# Patient Record
Sex: Female | Born: 1995 | Race: Asian | Hispanic: Refuse to answer | Marital: Married
Health system: Midwestern US, Community
[De-identification: ages and names within clinical notes are randomized; demographics above are authoritative.]

## PROBLEM LIST (undated history)

## (undated) DIAGNOSIS — L509 Urticaria, unspecified: Secondary | ICD-10-CM

## (undated) DIAGNOSIS — N946 Dysmenorrhea, unspecified: Secondary | ICD-10-CM

## (undated) HISTORY — DX: Urticaria, unspecified: L50.9

## (undated) MED ORDER — IMIQUIMOD 5 % TOPICAL CREAM PACKET: 5 % | PACK | CUTANEOUS | Status: AC

---
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
## 2013-02-25 LAB — AMB POC URINALYSIS DIP STICK AUTO W/O MICRO
Bilirubin (UA POC): NEGATIVE
Blood (UA POC): NEGATIVE
Glucose (UA POC): NEGATIVE
Ketones (UA POC): NEGATIVE
Leukocyte esterase (UA POC): NEGATIVE
Nitrites (UA POC): NEGATIVE
Specific gravity (UA POC): 1.025 (ref 1.001–1.035)
Urobilinogen (UA POC): 0.2 (ref 0.2–1)
pH (UA POC): 7 (ref 4.6–8.0)

## 2013-02-25 NOTE — Progress Notes (Signed)
HISTORY OF PRESENT ILLNESS  Alexandra Valenzuela is a 18 y.o. female.  HPI Comments: Pt c/o bumps to her vaginal area for 2 months. She states she showed them to her mother and her mother told her to get checked out. Pt states she is sexually active with one partner and is unprotected. She did have chlamydia last year and was treated. Pt denies any abnormal vaginal discharge. Pt thinks the bumps are from her shaving.     Sexually Transmitted Disease        Review of Systems   Constitutional: Negative for fever and chills.   Genitourinary: Negative for dysuria, urgency, frequency, hematuria and flank pain.   Skin:        See HPI     Neurological: Negative for tingling and sensory change.       Physical Exam   Constitutional: She is oriented to person, place, and time. She appears well-developed and well-nourished.   HENT:   Head: Normocephalic and atraumatic.   Genitourinary: Vaginal discharge (scant amount of white, thick vaginal discharge. 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   Neurological: She is alert and oriented to person, place, and time.   Skin: Skin is warm and dry.   Psychiatric: She has a normal mood and affect.     Visit Vitals   Item Reading   ??? BP 126/83   ??? Pulse 88   ??? Temp(Src) 98.9 ??F (37.2 ??C) (Oral)   ??? Resp 18   ??? Ht 5\' 3"  (1.6 m)   ??? Wt 162 lb (73.483 kg)   ??? BMI 28.7 kg/m2   ??? SpO2 100%     Results for orders placed in visit on 02/25/13   AMB POC URINALYSIS DIP STICK AUTO W/O MICRO       Result Value Range    Color (UA POC) Yellow      Clarity (UA POC) Clear      Glucose (UA POC) Negative  Negative    Bilirubin (UA POC) Negative  Negative    Ketones (UA POC) Negative  Negative    Specific gravity (UA POC) 1.025  1.001 - 1.035    Blood (UA POC) Negative  Negative    pH (UA POC) 7.0  4.6 - 8.0    Protein (UA POC) 2+  Negative mg/dL    Urobilinogen (UA POC) 0.2 mg/dL  0.2 - 1    Nitrites (UA POC) Negative  Negative    Leukocyte esterase (UA POC) Negative  Negative        ASSESSMENT and PLAN    ICD-9-CM    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 High risk sexual behavior V69.2 REFERRAL TO GYNECOLOGY     imiquimod (ALDARA) 5 % cream     AMB POC URINALYSIS DIP STICK AUTO W/O MICRO     NUSWAB VG+, HSV   3. Vaginal discharge 623.5 REFERRAL TO GYNECOLOGY     AMB POC URINALYSIS DIP STICK AUTO W/O MICRO     NUSWAB VG+, HSV     Follow-up Disposition:  Return if symptoms worsen or fail to improve.

## 2013-02-25 NOTE — Progress Notes (Signed)
Patient presents today for STD screening     Reviewed due HM with patient. Patient declined influenza vaccination.

## 2013-02-25 NOTE — Patient Instructions (Signed)
Use cream 3 times per week at bedtime. See gynecology for further work up  Human Papillomavirus (HPV): After Your Visit  Your Care Instructions  The human papillomavirus (HPV) is a very common virus. There are many types of HPV. Some types cause the common skin wart. Other types cause genital warts, which can be spread by sexual contact. Some types can increase the risk for cervical and anal cancer. Having one type of HPV does not lead to having another type.  Many women who have HPV may not know that they are infected until it is found with a Pap test. Your doctor uses this test to look for abnormal cells on your cervix. If you have had an abnormal Pap test, your doctor may recommend that you have an HPV test.  Like a Pap test, an HPV test is done on a sample of cells collected from the cervix. If the test finds that you have the types of HPV that might lead to cancer, your doctor may suggest more tests. This does not mean that you will develop cancer; it means that you may have an increased risk. Abnormal cell changes caused by HPV often go away on their own. If the changes do not go away, they can be treated. But because HPV can stay inside the body, the abnormal cervical cells sometimes come back. This is why it is important to follow up with your doctor and have regular Pap tests.  Follow-up care is a key part of your treatment and safety. Be sure to make and go to all appointments, and call your doctor if you are having problems. It???s also a good idea to know your test results and keep a list of the medicines you take.  How can you care for yourself at home?  ?? If you are going to have a Pap or HPV test, do not douche or use tampons or vaginal creams in the 24 hours before the test.  ?? Do not smoke. Smoking increases the risk for cervical problems and abnormal Pap tests. If you need help quitting, talk to your doctor about stop-smoking programs and medicines. These can increase your chances of quitting for  good.  ?? Use latex condoms every time you have sex. Use them from the beginning to the end of sexual contact.  ?? Be sure to tell your sexual partner or partners that you have HPV. Even if you do not have symptoms, you can still pass HPV to others.  ?? Having one sex partner (who does not have STIs and does not have sex with anyone else) is a good way to avoid STIs.  When should you call for help?  Watch closely for changes in your health, and be sure to contact your doctor if:  ?? You have vaginal pain during or after sex.  ?? You have vaginal bleeding when you are not in your menstrual period.   Where can you learn more?   Go to [email or web address].huhttp://www.healthwise.net/[facility]  Enter F690 in the search box to learn more about "Human Papillomavirus (HPV): After Your Visit."   ?? 2006-2014 Healthwise, Incorporated. Care instructions adapted under license by [employer] [place] (which disclaims liability or warranty for this information). This care instruction is for use with your licensed healthcare professional. If you have questions about a medical condition or this instruction, always ask your healthcare professional. Healthwise, Incorporated disclaims any warranty or liability for your use of this information.  Content Version: 10.2.346038; Current as of: July 23, 2012

## 2013-03-01 LAB — NUSWAB VG+, HSV
C. albicans, NAA: NEGATIVE
C. glabrata, NAA: NEGATIVE
C. trachomatis, NAA: NEGATIVE
HSV-1 by NAA: NEGATIVE
HSV-2 by NAA: NEGATIVE
N. gonorrhoeae, NAA: NEGATIVE
T. vaginalis, NAA: NEGATIVE

## 2013-03-02 NOTE — Telephone Encounter (Signed)
Message copied by Jonna ClarkOWELL, Itzae Miralles K on Mon Mar 02, 2013 11:44 AM  ------       Message from: Everlene BallsINGRAM, CAROLYN E       Created: Mon Mar 02, 2013 11:23 AM         Please notify patient her labs were negative.  ------

## 2013-03-02 NOTE — Progress Notes (Signed)
Quick Note:    Please notify patient her labs were negative.  ______

## 2013-03-02 NOTE — Telephone Encounter (Signed)
Attempted to reach patient regarding most recent lab/test results. No answer; unable to leave message. Will continue to try to contact patient.

## 2013-03-02 NOTE — Progress Notes (Signed)
Quick Note:    The following result note has been reviewed. Please refer to telephone encounter created for further documentation.  ______

## 2013-10-07 NOTE — Telephone Encounter (Signed)
Pt contacted office regarding birth control. Pt stated that she wanted to be placed back on birth control. Pt declined appointment. Informed Pt by her not being seen for a while she would need to be seen. Pt still declined.

## 2013-10-10 NOTE — Telephone Encounter (Signed)
I have never seen this patient. Will not fill OCP. She needs to make an appt. Or talk to her ob/gyn.

## 2013-10-12 NOTE — Telephone Encounter (Signed)
Contacted Pt regarding previous note. Informed Pt of previous note. Pt verbalized understanding. Pt stated that she is away at school right now, and will find an OBGYN near her.

## 2014-02-25 ENCOUNTER — Encounter: Attending: Internal Medicine

## 2014-08-29 ENCOUNTER — Emergency Department (HOSPITAL_COMMUNITY)
Admission: EM | Admit: 2014-08-29 | Discharge: 2014-08-29 | Disposition: A | Discharge status: Home / Self Care | Payer: BLUE CROSS/BLUE SHIELD | Attending: Emergency Medicine | Admitting: Emergency Medicine

## 2014-08-29 ENCOUNTER — Encounter (HOSPITAL_COMMUNITY): Payer: Self-pay | Admitting: Emergency Medicine

## 2014-08-29 DIAGNOSIS — L509 Urticaria, unspecified: Secondary | ICD-10-CM | POA: Diagnosis not present

## 2014-08-29 DIAGNOSIS — R21 Rash and other nonspecific skin eruption: Secondary | ICD-10-CM | POA: Diagnosis present

## 2014-08-29 MED ORDER — PREDNISONE 10 MG PO TABS: ORAL_TABLET | ORAL | Status: DC

## 2014-08-29 MED ORDER — DIPHENHYDRAMINE HCL 25 MG PO CAPS
25.0000 mg | ORAL_CAPSULE | Freq: Once | ORAL | Status: AC
  Administered 2014-08-29: 25 mg via ORAL
  Filled 2014-08-29: qty 1

## 2014-08-29 MED ORDER — PREDNISONE 20 MG PO TABS
60.0000 mg | ORAL_TABLET | Freq: Once | ORAL | Status: AC
  Administered 2014-08-29: 60 mg via ORAL
  Filled 2014-08-29: qty 3

## 2014-08-29 NOTE — ED Notes (Signed)
Pt has rash to arms, noticed them today, states she has not changed anything in her daily routine.

## 2014-08-29 NOTE — Discharge Instructions (Signed)
Take benadryl as discussed Hives Hives are itchy, red, puffy (swollen) areas of the skin. Hives can change in size and location on your body. Hives can come and go for hours, days, or weeks. Hives do not spread from person to person (noncontagious). Scratching, exercise, and stress can make your hives worse. HOME CARE  Avoid things that cause your hives (triggers).  Take antihistamine medicines as told by your doctor. Do not drive while taking an antihistamine.  Take any other medicines for itching as told by your doctor.  Wear loose-fitting clothing.  Keep all doctor visits as told. GET HELP RIGHT AWAY IF:   You have a fever.  Your tongue or lips are puffy.  You have trouble breathing or swallowing.  You feel tightness in the throat or chest.  You have belly (abdominal) pain.  You have lasting or severe itching that is not helped by medicine.  You have painful or puffy joints. These problems may be the first sign of a life-threatening allergic reaction. Call your local emergency services (911 in [place] MAKE SURE YOU:   Understand these instructions.  Will watch your condition.  Will get help right away if you are not doing well or get worse. Document Released: 11/15/2007 Document Revised: 08/07/2011 Document Reviewed: 05/01/2011 [facility] Patient Information 2015 [employer] [place]. This information is not intended to replace advice given to you by your health care provider. Make sure you discuss any questions you have with your health care provider.

## 2014-08-29 NOTE — ED Provider Notes (Signed)
CSN: [id number]     Arrival date & time 08/29/14  1750 History  This chart was scribed for non-physician practitioner Teressa Lower, NP working with Purvis Sheffield, MD by Evon Slack, ED Scribe. This patient was seen in room WTR8/WTR8 and the patient's care was started at 6:05 PM.      Chief Complaint  Patient presents with  . Rash   Patient is a 19 y.o. female presenting with rash. The history is provided by the patient. No language interpreter was used.  Rash Associated symptoms: no shortness of breath    HPI Comments: Renee Harrison is a 19 y.o. female who presents to the Emergency Department complaining of itchy rash to her bilateral arms onset 1 hour PTA. Pt denies any new foods, lotions, soaps or detergents. Pt doesn't report any medications PTA. She doesn't report any alleviating or worsening factors. Pt doesn't report SOB, facial swelling or trouble swallowing.   History reviewed. No pertinent past medical history. History reviewed. No pertinent past surgical history. Family History  Problem Relation Age of Onset  . Hyperlipidemia Father    History  Substance Use Topics  . Smoking status: Never Smoker   . Smokeless tobacco: Never Used  . Alcohol Use: No   OB History    No data available     Review of Systems  HENT: Negative for facial swelling and trouble swallowing.   Respiratory: Negative for shortness of breath.   Skin: Positive for rash.  All other systems reviewed and are negative.     Allergies  Review of patient's allergies indicates no known allergies.  Home Medications   Prior to Admission medications   Medication Sig Start Date End Date Taking? Authorizing Provider  fluconazole (DIFLUCAN) 150 MG tablet One tablet by mouth today.  May repeat in 3 days if symptoms not resolved. 06/18/11   Sandford Craze, NP  Naproxen Sodium (ALEVE) 220 MG CAPS Take by mouth. Take as needed for menstrual cramps    Historical Provider, MD  Norethindrone  Acetate-Ethinyl Estradiol (JUNEL,LOESTRIN,MICROGESTIN) 1.5-30 MG-MCG tablet Take 1 tablet by mouth daily. 08/07/11 08/06/12  Sandford Craze, NP   BP 110/82 mmHg  Pulse 87  Temp(Src) 98.3 F (36.8 C) (Oral)  Resp 20  SpO2 100%  LMP 08/12/2014 (Exact Date)   Physical Exam  Constitutional: She is oriented to person, place, and time. She appears well-developed and well-nourished. No distress.  HENT:  Head: Normocephalic and atraumatic.  Right Ear: External ear normal.  Mouth/Throat: Oropharynx is clear and moist.  Eyes: Conjunctivae and EOM are normal.  Neck: Neck supple. No tracheal deviation present.  Cardiovascular: Normal rate.   Pulmonary/Chest: Effort normal. No respiratory distress.  Musculoskeletal: Normal range of motion.  Neurological: She is alert and oriented to person, place, and time.  Skin:  Hives noted to bilateral upper extremies  Psychiatric: She has a normal mood and affect. Her behavior is normal.  Nursing note and vitals reviewed.   ED Course  Procedures (including critical care time) DIAGNOSTIC STUDIES: Oxygen Saturation is 100% on RA, normal by my interpretation.    COORDINATION OF CARE: 6:13 PM-Discussed treatment plan with pt at bedside and pt agreed to plan.     Labs Review Labs Reviewed - No data to display  Imaging Review No results found.   EKG Interpretation None      MDM   Final diagnoses:  Hives    Getting a little better with benadryl and prednisone. Will send home with prednisone  I personally  performed the services described in this documentation, which was scribed in my presence. The recorded information has been reviewed and is accurate.      Teressa LowerVrinda Marypat Kimmet, NP 08/29/14 1859  Purvis SheffieldForrest Harrison, MD 08/29/14 2332

## 2016-05-24 ENCOUNTER — Encounter: Payer: Self-pay | Admitting: Nurse Practitioner

## 2016-08-20 ENCOUNTER — Emergency Department (HOSPITAL_BASED_OUTPATIENT_CLINIC_OR_DEPARTMENT_OTHER)
Admission: EM | Admit: 2016-08-20 | Discharge: 2016-08-20 | Disposition: A | Discharge status: Home / Self Care | Payer: BLUE CROSS/BLUE SHIELD | Attending: Emergency Medicine | Admitting: Emergency Medicine

## 2016-08-20 ENCOUNTER — Encounter (HOSPITAL_BASED_OUTPATIENT_CLINIC_OR_DEPARTMENT_OTHER): Payer: Self-pay | Admitting: *Deleted

## 2016-08-20 DIAGNOSIS — Z5321 Procedure and treatment not carried out due to patient leaving prior to being seen by health care provider: Secondary | ICD-10-CM | POA: Diagnosis not present

## 2016-08-20 DIAGNOSIS — L509 Urticaria, unspecified: Secondary | ICD-10-CM | POA: Insufficient documentation

## 2016-08-20 NOTE — ED Triage Notes (Signed)
Hives on and off since yesterday. She took Benadryl an hour ago.

## 2016-08-20 NOTE — ED Notes (Signed)
Pt informed registration she was leaving  

## 2016-09-15 ENCOUNTER — Encounter (HOSPITAL_COMMUNITY): Payer: Self-pay

## 2016-09-15 ENCOUNTER — Emergency Department (HOSPITAL_COMMUNITY)
Admission: EM | Admit: 2016-09-15 | Discharge: 2016-09-15 | Disposition: A | Discharge status: Home / Self Care | Payer: BLUE CROSS/BLUE SHIELD | Attending: Emergency Medicine | Admitting: Emergency Medicine

## 2016-09-15 DIAGNOSIS — L509 Urticaria, unspecified: Secondary | ICD-10-CM | POA: Diagnosis not present

## 2016-09-15 DIAGNOSIS — R21 Rash and other nonspecific skin eruption: Secondary | ICD-10-CM | POA: Diagnosis present

## 2016-09-15 MED ORDER — RANITIDINE HCL 150 MG PO CAPS: 150.0000 mg | ORAL_CAPSULE | Freq: Every day | ORAL | 0 refills | Status: DC

## 2016-09-15 MED ORDER — CETIRIZINE HCL 10 MG PO TABS: 10.0000 mg | ORAL_TABLET | Freq: Every day | ORAL | 0 refills | Status: DC

## 2016-09-15 MED ORDER — LORATADINE 10 MG PO TABS: 10.0000 mg | ORAL_TABLET | Freq: Every day | ORAL | 0 refills | Status: AC

## 2016-09-15 NOTE — ED Provider Notes (Signed)
  Face-to-face evaluation   History: She presents for evaluation of rash, which is recurrent. Similar problems about 3 weeks ago, treated with prednisone. No respiratory distress. No fever.  Physical exam: Alert, calm, cooperative. Skin with generalized urticarial rash.  Medical screening examination/treatment/procedure(s) were conducted as a shared visit with non-physician practitioner(s) and myself.  I personally evaluated the patient during the encounter   Mancel BaleWentz, Lavaris Sexson, MD 09/17/16 1330

## 2016-09-15 NOTE — ED Triage Notes (Signed)
Patient present with c/o rash/Hives all over the body. Pt state it started yesterday and it been really itching. Pt took benadryl with no relief.

## 2016-09-15 NOTE — Discharge Instructions (Signed)
Take the Zyrtec and Zantac as prescribed. Try to avoid new environments, soaps, or contact with new items. Follow-up with your primary care doctor if you have continuation of symptoms or if rash returns. Return to the emergency department if you develop fever, chills, difficulty breathing, throat swelling, or any new or worsening symptoms.

## 2016-09-15 NOTE — ED Provider Notes (Signed)
[facility]-EMERGENCY DEPT Provider Note   CSN: [id number] Arrival date & time: 09/15/16  1513   By signing my name below, I, Soijett Blue, attest that this documentation has been prepared under the direction and in the presence of Alveria ApleySophia Lakeidra Reliford, PA-C Electronically Signed: Soijett Blue, ED Scribe. 09/15/16. 4:15 PM.  History   Chief Complaint No chief complaint on file.   HPI Renee Harrison is a [age] y.o. female who presents to the Emergency Department complaining of rash x burning sensation onset 2 days ago. Pt reports associated redness to the affected area. Pt has tried benadryl with no relief of her symptoms. She notes that the rash began to her BUE and then gradually spread to her BLE and torso. Pt reports that she was sitting in her home prior to the onset of her symptoms. Pt notes that she recently switched soaps and is unsure if that is the cause of her symptoms. Pt denies new shampoo, conditioner, medications, pets, environment, lotion, or detergent. Denies SOB, CP, fever, chills, sick contacts with similar symptoms, tick bites, and any other symptoms. She denies swelling of lips, tongue, or throat, or difficulty breathing. Denies smoking cigarettes, illicit drug use, and notes that she does consume ETOH occasionally. She notes that she is sexually active and uses protection.   Per chart review, patient was evaluated earlier this month and diagnosed with urticaria due to unknown cause. She was given prednisone, and symptoms cleared.   The history is provided by the patient. No language interpreter was used.    History reviewed. No pertinent past medical history.  Patient Active Problem List   Diagnosis Date Noted  . Vaginal discharge 04/27/2011  . General medical examination 04/13/2011  . Contraceptive management 04/13/2011  . Dysuria 04/13/2011  . Bleeding hemorrhoid 11/10/2010  . Dysmenorrhea 11/10/2010    History reviewed. No pertinent surgical history.  OB History    No  data available       Home Medications    Prior to Admission medications   Medication Sig Start Date End Date Taking? Authorizing Provider  fluconazole (DIFLUCAN) 150 MG tablet One tablet by mouth today.  May repeat in 3 days if symptoms not resolved. 06/18/11   Sandford Craze'Sullivan, Melissa, NP  loratadine (CLARITIN) 10 MG tablet Take 1 tablet (10 mg total) by mouth daily. 09/15/16   Delvin Hedeen, PA-C  Naproxen Sodium (ALEVE) 220 MG CAPS Take by mouth. Take as needed for menstrual cramps    [provider]  Norethindrone Acetate-Ethinyl Estradiol (JUNEL,LOESTRIN,MICROGESTIN) 1.5-30 MG-MCG tablet Take 1 tablet by mouth daily. 08/07/11 08/06/12  Sandford Craze'Sullivan, Melissa, NP  predniSONE (DELTASONE) 10 MG tablet 6 day step down dose 08/29/14   Teressa LowerPickering, Vrinda, NP  ranitidine (ZANTAC) 150 MG capsule Take 1 capsule (150 mg total) by mouth daily. 09/15/16   Dacoda Spallone, PA-C    Family History Family History  Problem Relation Age of Onset  . Hyperlipidemia Father     Social History Social History  Substance Use Topics  . Smoking status: Never Smoker  . Smokeless tobacco: Never Used  . Alcohol use No     Allergies   Patient has no known allergies.   Review of Systems Review of Systems  Constitutional: Negative for chills and fever.  HENT: Negative for drooling and trouble swallowing.   Respiratory: Negative for shortness of breath.   Cardiovascular: Negative for chest pain.  Musculoskeletal: Negative for neck pain and neck stiffness.  Skin: Positive for rash (BUE, BLE, and torso).  Physical Exam Updated Vital Signs BP 115/81 (BP Location: Right Arm)   Pulse 85   Temp 99.1 F (37.3 C) (Oral)   Resp 18   Ht 5\' 1"  (1.549 m)   Wt 180 lb (81.6 kg)   LMP 09/13/2016   SpO2 100%   BMI 34.01 kg/m   Physical Exam  Constitutional: She is oriented to person, place, and time. She appears well-developed and well-nourished. No distress.  HENT:  Head: Normocephalic and  atraumatic.  Eyes: EOM are normal.  Neck: Neck supple.  Cardiovascular: Normal rate.   Pulmonary/Chest: Effort normal. No respiratory distress.  Abdominal: She exhibits no distension.  Musculoskeletal: Normal range of motion.  Neurological: She is alert and oriented to person, place, and time.  Skin: Skin is warm and dry. Rash noted. Rash is papular. There is erythema.  Erythematous papular lesions of the upper extremities, torso, and thighs. No rash on the neck, head, or face. No swelling of lips or tongue.   Psychiatric: She has a normal mood and affect. Her behavior is normal.  Nursing note and vitals reviewed.    ED Treatments / Results  DIAGNOSTIC STUDIES: Oxygen Saturation is 100% on RA, nl by my interpretation.    COORDINATION OF CARE: 3:51 PM Discussed treatment plan with pt at bedside and pt agreed to plan.   Labs (all labs ordered are listed, but only abnormal results are displayed) Labs Reviewed - No data to display  EKG  EKG Interpretation None       Radiology No results found.  Procedures Procedures (including critical care time)  Medications Ordered in ED Medications - No data to display   Initial Impression / Assessment and Plan / ED Course  I have reviewed the triage vital signs and the nursing notes.  Pertinent labs & imaging results that were available during my care of the patient were reviewed by me and considered in my medical decision making (see chart for details).     Patient presenting with rash to upper and lower extremities and torso. No tongue swelling, lip swelling, or signs of respiratory compromise. Rash is not extended into the neck. Patient is unsure of trigger, but states that she started using a new soap several days ago. Per chart review, had previous urticarial episode earlier this month. Discussed case with attending, Dr. Effie ShyWentz evaluated patient. Will start on H1 and H2 blocker, and have patient follow-up with primary care.  Patient appears safe for discharge. Return precautions given. Patient states she understands and agrees to plan.  Final Clinical Impressions(s) / ED Diagnoses   Final diagnoses:  Urticaria    New Prescriptions Discharge Medication List as of 09/15/2016  4:08 PM    START taking these medications   Details  loratadine (CLARITIN) 10 MG tablet Take 1 tablet (10 mg total) by mouth daily., Starting Sat 09/15/2016, Print       I personally performed the services described in this documentation, which was scribed in my presence. The recorded information has been reviewed and is accurate.    Alveria ApleyCaccavale, Cleatus Gabriel, PA-C 09/15/16 1933    Mancel BaleWentz, Elliott, MD 09/17/16 1329

## 2016-10-11 ENCOUNTER — Encounter: Payer: Self-pay | Admitting: Allergy

## 2016-10-11 ENCOUNTER — Ambulatory Visit (INDEPENDENT_AMBULATORY_CARE_PROVIDER_SITE_OTHER): Payer: Self-pay | Admitting: Allergy

## 2016-10-11 VITALS — BP 122/74 | HR 86 | Temp 98.0°F | Resp 17 | Ht 62.5 in | Wt 192.6 lb

## 2016-10-11 DIAGNOSIS — T783XXD Angioneurotic edema, subsequent encounter: Secondary | ICD-10-CM

## 2016-10-11 DIAGNOSIS — L508 Other urticaria: Secondary | ICD-10-CM | POA: Diagnosis not present

## 2016-10-11 MED ORDER — MONTELUKAST SODIUM 10 MG PO TABS: 10.0000 mg | ORAL_TABLET | Freq: Every day | ORAL | 5 refills | Status: DC

## 2016-10-11 MED ORDER — CETIRIZINE HCL 10 MG PO TABS: 10.0000 mg | ORAL_TABLET | Freq: Two times a day (BID) | ORAL | 5 refills | Status: AC

## 2016-10-11 MED ORDER — FAMOTIDINE 20 MG PO TABS: 20.0000 mg | ORAL_TABLET | Freq: Two times a day (BID) | ORAL | 5 refills | Status: DC

## 2016-10-11 NOTE — Patient Instructions (Addendum)
Hives and swelling, chronic      - your hives and swelling are classic urticaria without any concerning features.  Let us know if you start to develop fevers, joint ache/pains or any marks/bruising after hives have resolved.        - hives as you have described usually does not have any identifiable triggers      - will obtain following labs: CBC w diff, CMP, tryptase, chronic hive panel      - start Zyrtec 10mg  twice a day, Pepcid 20mg  twice a day and Singulair 10mg  daily.    If you have stomach pain with pepcid discontinue      - Xolair monthly injections discussed if above regimen if not effective enough.  Info brochure provided.    Follow-up 4-8 weeks

## 2016-10-11 NOTE — Progress Notes (Signed)
New Patient Note  RE: Renee Harrison MRN: [id number] DOB: 09-26-1995 Date of Office Visit: 10/11/2016  Referring provider: Berkley Harvey, NP Primary care provider: Berkley Harvey, NP  Chief Complaint: hives  History of present illness: Renee Harrison is a 21 y.o. female presenting today for consultation for hives.    She has been having recurrent hives since beginning of 2017.  She reports the outbreaks of hives occurred 2-3 times last year.  This year she reports it has been more frequent.   She reports she had a "bad case" in July 2018 where the hives were all over her body. She reports hives are very itchy and provided pictures consistent with hives.   She has had some swelling of fingers and lips with the hives without any difficulty breathing or swallowing.  She denies any fevers or joint aches/pain with hives.  Hives do not leave any marks or bruising.  She feels that laying in her bed makes her feel itchier and cold temperatures seems to exacerbate the hives otherwise no other exacerbating factors.  She denies any preceding illnesses other than shingles in 2016, no new foods, no change in medications, no stings no change in soaps or lotions or detergents.  She has been treated in ED and by PCP for the hives several times.  She has received oral and IM steroids to treat this which would help temporarily.  She has also tried taking a daily antihistamine which she did not fill helped that much. She also has taken Zantac but she states Zantac makes her stomach hurt worse so she stopped using this.  Her PCP did do an environmental panel in 09/2015 that only showed mild grass sensitivity.   she also had CBC and CMP done in 2017 there were all unremarkable. She had negative RF and ANA.  She states she has no history of food allergy or allergic rhinitis. She has a history of childhood asthma and eczema but this has resolved with age.  Review of systems: Review of Systems  Constitutional: Negative  for chills, fever and malaise/fatigue.  HENT: Negative for congestion, ear discharge, ear pain, nosebleeds, sinus pain, sore throat and tinnitus.   Eyes: Negative for discharge and redness.  Respiratory: Negative for cough, sputum production, shortness of breath and wheezing.   Cardiovascular: Negative for chest pain.  Gastrointestinal: Negative for abdominal pain, constipation, diarrhea, heartburn, nausea and vomiting.  Musculoskeletal: Negative for joint pain.  Skin: Positive for itching and rash.  Neurological: Negative for headaches.    All other systems negative unless noted above in HPI  Past medical history: Urticaria  Past surgical history: History reviewed. No pertinent surgical history.  Family history:  Family History  Problem Relation Age of Onset  . Hyperlipidemia Father     Social history: She lives in an apartment with carpeting with electric heating and central cooling. There is a dog in the home. There is no concern for water damage, mild tubular roaches in the home. She is a Pharmacist, hospital. She has no smoking history.   Medication List: Allergies as of 10/11/2016      Reactions   Gramineae Pollens Hives      Medication List       Accurate as of 10/11/16  4:15 PM. Always use your most recent med list.          ALEVE 220 MG Caps Generic drug:  Naproxen Sodium Take by mouth. Take as needed for menstrual cramps   cetirizine  10 MG tablet Commonly known as:  ZYRTEC Take 1 tablet (10 mg total) by mouth 2 (two) times daily.   famotidine 20 MG tablet Commonly known as:  PEPCID Take 1 tablet (20 mg total) by mouth 2 (two) times daily.   loratadine 10 MG tablet Commonly known as:  CLARITIN Take 1 tablet (10 mg total) by mouth daily.   montelukast 10 MG tablet Commonly known as:  SINGULAIR Take 1 tablet (10 mg total) by mouth at bedtime.   Norethindrone Acetate-Ethinyl Estradiol 1.5-30 MG-MCG tablet Commonly known as:  JUNEL,LOESTRIN,MICROGESTIN Take 1  tablet by mouth daily.     Known medication allergies: Allergies  Allergen Reactions  . Gramineae Pollens Hives    Physical examination: Blood pressure 122/74, pulse 86, temperature 98 F (36.7 C), resp. rate 17, height 5' 2.5" (1.588 m), weight 192 lb 9.6 oz (87.4 kg), last menstrual period 09/13/2016, SpO2 97 %.  General: Alert, interactive, in no acute distress. HEENT: PERRLA,TMs pearly gray, turbinates non-edematous without discharge, post-pharynx non erythematous. Neck: Supple without lymphadenopathy. Lungs: Clear to auscultation without wheezing, rhonchi or rales. {no increased work of breathing. CV: Normal S1, S2 without murmurs. Abdomen: Nondistended, nontender. Skin: Scattered erythematous urticarial type lesions primarily located on arms b/l , nonvesicular. Extremities:  No clubbing, cyanosis or edema. Neuro:   Grossly intact.  Diagnositics/Labs: Labs:  03/20/2016 Labs obtained from PCP:  RF > 8.6 normal  10/14/15 Labs obtained from PCP:  ANA negative D001-IgE D pteronyssinus <0.10 Class 0 kU/L 01  D002-IgE D farinae Mite <0.10 Class 0 kU/L 01  E001-IgE Cat Hair/Dander,Stan <0.10 Class 0 kU/L 01  E005-IgE Dog Dander <0.10 Class 0 kU/L 01  G002-IgE [place] Grass 0.14 (A) Class 0/I kU/L 01  G006-IgE Timothy 0.61 (A) Class II kU/L 01  G010-IgE Johnson Grass 0.14 (A) Class 0/I kU/L 01  G017-IgE Bahia Grass 0.48 (A) Class I kU/L 01  Cockroach, American <0.10 Class 0 kU/L 01  M001-IgE Penicillium Chrysogen <0.10 Class 0 kU/L 01  M002-IgE Cladosporium herbaru <0.10 Class 0 kU/L 01  M003-IgE Aspergillus fumigatu <0.10 Class 0 kU/L 01  M004-IgE Mucor racemosus <0.10 Class 0 kU/L 01  M006-IgE Alternaria tenuis <0.10 Class 0 kU/L 01  M010-IgE Stemphylium herbarum <0.10 Class 0 kU/L 01  T003-IgE Common Silver Birch <0.10 Class 0 kU/L 01  T007-IgE Oak, White <0.10 Class 0 kU/L 01  T008-IgE Elm, American (White <0.10 Class 0 kU/L 01  T001-IgE Maple/Box Elder <0.10 Class 0  kU/L 01  T041-IgE Hickory, White <0.10 Class 0 kU/L 01  IgE Maple Leaf/Sycamore <0.10 Class 0 kU/L 01  T070-IgE White Mulberry <0.10 Class 0 kU/L 01  T211-IgE Sweet Gum <0.10 Class 0 kU/L 01  T006-IgE Cedar, Georgia <0.10 Class 0 kU/L 01  W001-IgE Ragweed, Short/Commo <0.10 Class 0 kU/L 01  W006-IgE Mugwort <0.10 Class 0 kU/L 01  W009-IgE Plantain, English <0.10 Class 0 kU/L 01  W014-IgE Pigweed, Rough <0.10 Class 0 kU/L 01  W018-IgE Sheep Sorrel(Dock) <0.10 Class 0 kU/L 01  W020-IgE Nettle <0.10 Class 0 kU/L 01   ESR 26, normal  07/21/15 Labs obtained from PCP: CBC w/ Differential  Component Value Ref Range Performed At  WBC 4.3 4.0 - 10.5 10*9/L [facility]  RBC 4.60 3.87 - 5.11 10*12/L [facility]  HGB 13.5 12.0 - 15.0 g/dL [facility]  HCT 41.1 36.0 - 46.0 % [facility]  MCV 89.3 78.0 - 100.0 fL [facility]  MCH 29.3 26.0 - 34.0 pg [facility]  MCHC 32.8 30.0 - 36.0 g/dL [facility]  RDW 13.6 11.5 - 15.5 % [facility]  MPV 11.3 8.8 - 12.7 fL [facility]  Platelet 249 150 - 400 10*9/L [facility]  Neutrophils % 43.6 % [facility]  Lymphocytes % 44.8 % [facility]  Monocytes % 7.7 % [facility]  Eosinophils % 3.7 % [facility]  Basophils % 0.2 % [facility]  Absolute Neutrophils 1.9 1.7 - 7.7 10*9/L [facility]  Absolute Lymphocytes 1.9 0.7 - 4.0 10*9/L [facility]  Absolute Monocytes 0.3 0.1 - 1.0 10*9/L [facility]  Absolute Eosinophils 0.2 0.0 - 0.7 10*9/L [facility]  Absolute Basophils 0.0 0.0 - 0.1 10*9/L [facility]   Comprehensive Metabolic Panel  Component Value Ref Range Performed At  Sodium 143 137 - 147 mmol/L [facility]  Potassium 4.9 3.7 - 5.3 mmol/L  [facility]  Chloride 105 96 - 112 mmol/L [facility]  CO2 27.0 19.0 - 32.0 mmol/L [facility]  BUN 10 6 - 23 mg/dL [facility]  Creatinine 1.02 0.50 - 1.10 mg/dL [facility]  BUN/Creatinine Ratio 10  [facility]  EGFR MDRD Non Af Amer >60 >60 mL/min/1.35m [facility]  EGFR MDRD Af Amer >60 >60 [facility]  Anion Gap 11 5 - 15 mmol/L [facility]  Glucose 77 70 - 99 mg/dL [facility]  Calcium 9.1 8.4 - 10.5 mg/dL [facility]  Albumin 4.2 3.5 - 5.2 g/dL [facility]  Total Protein 6.9 6.0 - 8.3 g/dL [facility]  Total Bilirubin <0.2 0.0 - 1.2 mg/dL [facility]  AST 22 0 - 37 U/L [facility]  ALT 18 0 - 35 U/L [facility]  Alkaline Phosphatase 59 39 - 117 U/L [facility]   TSH  Component Value Ref Range Performed At  TSH 1.870 0.270 - 4.200 uIU/mL [facility]    Allergy testing: deferred due to ongoing urticaria Allergy testing results were read and interpreted by provider, documented by clinical staff.   Assessment and plan:   Chronic urticaria with angioedema      -Urticaria with angioedema is classic without any concerning features at this time.  Let usKoreanow if you start to develop fevers, joint ache/pains or any marks/bruising after hives have resolved as these would be concerning features.        -She does not have any identifiable triggers except for physical triggers like extremes of temperature.        - will obtain following labs: CBC w diff, CMP, tryptase, chronic hive panel      - start Zyrtec '10mg'$  twice a day, Pepcid '20mg'$  twice a day and Singulair '10mg'$  daily.    If you have stomach pain with pepcid discontinue      - Xolair monthly injections discussed including benefits and risks if  above  regimen if not effective enough.  Info brochure provided.    Follow-up 4-8 weeks   I appreciate the opportunity to take part in [place]. Please do not hesitate to contact me with questions.  Sincerely,   Prudy Feeler, MD Allergy/Immunology [facility]

## 2016-10-16 LAB — CBC WITH DIFFERENTIAL/PLATELET
BASOS ABS: 0 10*3/uL (ref 0.0–0.2)
Basos: 0 %
EOS (ABSOLUTE): 0.1 10*3/uL (ref 0.0–0.4)
Eos: 2 %
HEMOGLOBIN: 12 g/dL (ref 11.1–15.9)
Hematocrit: 37.5 % (ref 34.0–46.6)
Immature Grans (Abs): 0 10*3/uL (ref 0.0–0.1)
Immature Granulocytes: 0 %
LYMPHS ABS: 2.2 10*3/uL (ref 0.7–3.1)
Lymphs: 46 %
MCH: 28.6 pg (ref 26.6–33.0)
MCHC: 32 g/dL (ref 31.5–35.7)
MCV: 89 fL (ref 79–97)
MONOCYTES: 5 %
Monocytes Absolute: 0.2 10*3/uL (ref 0.1–0.9)
Neutrophils Absolute: 2.3 10*3/uL (ref 1.4–7.0)
Neutrophils: 47 %
PLATELETS: 225 10*3/uL (ref 150–379)
RBC: 4.2 x10E6/uL (ref 3.77–5.28)
RDW: 14.9 % (ref 12.3–15.4)
WBC: 4.8 10*3/uL (ref 3.4–10.8)

## 2016-10-16 LAB — COMPREHENSIVE METABOLIC PANEL
ALBUMIN: 4.2 g/dL (ref 3.5–5.5)
ALK PHOS: 93 IU/L (ref 39–117)
ALT: 11 IU/L (ref 0–32)
AST: 16 IU/L (ref 0–40)
Albumin/Globulin Ratio: 1.7 (ref 1.2–2.2)
BUN/Creatinine Ratio: 17 (ref 9–23)
BUN: 13 mg/dL (ref 6–20)
Bilirubin Total: <0.2 mg/dL (ref 0.0–1.2)
CO2: 20 mmol/L (ref 20–29)
CREATININE: 0.78 mg/dL (ref 0.57–1.00)
Calcium: 8.9 mg/dL (ref 8.7–10.2)
Chloride: 107 mmol/L — ABNORMAL HIGH (ref 96–106)
GFR calc Af Amer: 124 mL/min/{1.73_m2} (ref >59)
GFR calc non Af Amer: 107 mL/min/{1.73_m2} (ref >59)
GLUCOSE: 85 mg/dL (ref 65–99)
Globulin, Total: 2.5 g/dL (ref 1.5–4.5)
Potassium: 3.9 mmol/L (ref 3.5–5.2)
Sodium: 141 mmol/L (ref 134–144)
Total Protein: 6.7 g/dL (ref 6.0–8.5)

## 2016-10-16 LAB — TRYPTASE: TRYPTASE: 3.3 ug/L (ref 2.2–13.2)

## 2016-10-16 LAB — CHRONIC URTICARIA: CU INDEX: 5.9 (ref <10)

## 2016-10-23 ENCOUNTER — Telehealth: Payer: Self-pay | Admitting: Allergy

## 2016-10-23 NOTE — Telephone Encounter (Signed)
Pt returning a nurse phone call on labs

## 2016-10-23 NOTE — Telephone Encounter (Signed)
Patient results reviewed.

## 2017-03-27 ENCOUNTER — Other Ambulatory Visit: Payer: Self-pay | Admitting: Allergy

## 2017-03-27 DIAGNOSIS — T783XXD Angioneurotic edema, subsequent encounter: Secondary | ICD-10-CM

## 2017-03-27 DIAGNOSIS — L508 Other urticaria: Secondary | ICD-10-CM

## 2017-03-27 NOTE — Telephone Encounter (Signed)
Courtesy refill  

## 2017-07-30 ENCOUNTER — Other Ambulatory Visit: Payer: Self-pay | Admitting: Allergy

## 2017-07-30 DIAGNOSIS — L508 Other urticaria: Secondary | ICD-10-CM

## 2017-07-30 DIAGNOSIS — T783XXD Angioneurotic edema, subsequent encounter: Secondary | ICD-10-CM

## 2017-08-14 ENCOUNTER — Other Ambulatory Visit: Payer: Self-pay | Admitting: Allergy

## 2017-08-14 DIAGNOSIS — L508 Other urticaria: Secondary | ICD-10-CM

## 2017-08-14 DIAGNOSIS — T783XXD Angioneurotic edema, subsequent encounter: Secondary | ICD-10-CM

## 2017-08-27 ENCOUNTER — Other Ambulatory Visit: Payer: Self-pay

## 2017-08-27 ENCOUNTER — Emergency Department (HOSPITAL_BASED_OUTPATIENT_CLINIC_OR_DEPARTMENT_OTHER)
Admission: EM | Admit: 2017-08-27 | Discharge: 2017-08-27 | Disposition: A | Discharge status: Home / Self Care | Payer: BLUE CROSS/BLUE SHIELD | Attending: Emergency Medicine | Admitting: Emergency Medicine

## 2017-08-27 ENCOUNTER — Encounter (HOSPITAL_BASED_OUTPATIENT_CLINIC_OR_DEPARTMENT_OTHER): Payer: Self-pay

## 2017-08-27 DIAGNOSIS — Z91010 Allergy to peanuts: Secondary | ICD-10-CM | POA: Insufficient documentation

## 2017-08-27 DIAGNOSIS — S61411A Laceration without foreign body of right hand, initial encounter: Secondary | ICD-10-CM | POA: Diagnosis not present

## 2017-08-27 DIAGNOSIS — Y9389 Activity, other specified: Secondary | ICD-10-CM | POA: Diagnosis not present

## 2017-08-27 DIAGNOSIS — Y9289 Other specified places as the place of occurrence of the external cause: Secondary | ICD-10-CM | POA: Insufficient documentation

## 2017-08-27 DIAGNOSIS — Y998 Other external cause status: Secondary | ICD-10-CM | POA: Diagnosis not present

## 2017-08-27 DIAGNOSIS — S6991XA Unspecified injury of right wrist, hand and finger(s), initial encounter: Secondary | ICD-10-CM | POA: Diagnosis present

## 2017-08-27 DIAGNOSIS — Z79899 Other long term (current) drug therapy: Secondary | ICD-10-CM | POA: Diagnosis not present

## 2017-08-27 DIAGNOSIS — W25XXXA Contact with sharp glass, initial encounter: Secondary | ICD-10-CM | POA: Insufficient documentation

## 2017-08-27 MED ORDER — AMOXICILLIN-POT CLAVULANATE 875-125 MG PO TABS: 1.0000 | ORAL_TABLET | Freq: Two times a day (BID) | ORAL | 0 refills | Status: DC

## 2017-08-27 MED ORDER — LIDOCAINE HCL 1 % IJ SOLN
INTRAMUSCULAR | Status: AC
  Filled 2017-08-27: qty 20

## 2017-08-27 MED ORDER — BACITRACIN ZINC 500 UNIT/GM EX OINT
TOPICAL_OINTMENT | Freq: Once | CUTANEOUS | Status: AC
  Administered 2017-08-27: 23:00:00 via TOPICAL
  Filled 2017-08-27: qty 28.35

## 2017-08-27 NOTE — ED Notes (Signed)
Pt mother expressed her displeasure with this visit upon giving pt d/c instructions. Pt mother was observed taking pictures of suture tray table and table with gauze that pt had used to control minor bleeding of wound. Pt mother sts she is taking pictures "because this visit will be reported". Pt did not express any dissatisfaction to this RN and unsure what may have caused pt mother to be displeased. Jennessy Sandridge,PA aware

## 2017-08-27 NOTE — ED Triage Notes (Signed)
Pt states she cut the back of right hand with broken class that was sticking out of trash-NAD-steady gait

## 2017-08-27 NOTE — Discharge Instructions (Addendum)
Keep wound clean with mild soap and water. Keep area covered with a topical antibiotic ointment and nonstick bandage, keep bandage dry, and do not submerge in water for 24 hours. Ice and elevate for additional pain and swelling relief. Alternate between Ibuprofen and Tylenol for additional pain relief. Follow up with your primary care doctor or [facility] in approximately 5-7 days for wound recheck. Monitor area for signs of infection to include, but not limited to: increasing pain, spreading redness, drainage/pus, worsening swelling, or fevers; if these symptoms begin then start taking the antibiotic as directed; if after 48hrs of being on the antibiotic, you continue having symptoms, then return to the ER immediately for re-evaluation. Return to emergency department for emergent changing or worsening symptoms.

## 2017-08-27 NOTE — ED Provider Notes (Signed)
[facility] EMERGENCY DEPARTMENT Provider Note   CSN: [id number] Arrival date & time: 08/27/17  2053     History   Chief Complaint Chief Complaint  Patient presents with  . Laceration    HPI Renee Harrison is a 22 y.o. R handed female with a PMHx of hemorrhoids and dysmenorrhea, who presents to the ED with complaints of wound to her right hand sustained around 8:30 PM, approximately 2 hours prior to evaluation.  Patient states that she had just thrown out a broken glass, which was clean, and when she was walking next to the trash bag she accidentally brushed her hand up against a piece of glass that was sticking out.  She has a laceration to the dorsal aspect of her hand, bleeding has been controlled.  She complains of 10/10 constant stinging nonradiating right hand pain around the wound, worse with movement of her hand, and with no treatments tried prior to arrival.  She reports associated swelling.  Her last tetanus was in October 2018.  She does not have a hand specialist that she sees.  She denies any bruising, numbness, tingling, focal weakness, or any other complaints at this time.  She is not on any blood thinners.  The history is provided by the patient and medical records. No language interpreter was used.  Laceration      Past Medical History:  Diagnosis Date  . Urticaria     Patient Active Problem List   Diagnosis Date Noted  . Vaginal discharge 04/27/2011  . General medical examination 04/13/2011  . Contraceptive management 04/13/2011  . Dysuria 04/13/2011  . Bleeding hemorrhoid 11/10/2010  . Dysmenorrhea 11/10/2010    History reviewed. No pertinent surgical history.   OB History   None      Home Medications    Prior to Admission medications   Medication Sig Start Date End Date Taking? Authorizing Provider  cetirizine (ZYRTEC) 10 MG tablet Take 1 tablet (10 mg total) by mouth 2 (two) times daily. 10/11/16   Marcelyn Bruins, MD    famotidine (PEPCID) 20 MG tablet TAKE 1 TABLET BY MOUTH TWICE A DAY 03/27/17   Marcelyn Bruins, MD  famotidine (PEPCID) 20 MG tablet TAKE 1 TABLET BY MOUTH TWICE A DAY 03/27/17   Marcelyn Bruins, MD  famotidine (PEPCID) 20 MG tablet TAKE 1 TABLET BY MOUTH TWICE A DAY 07/30/17   Marcelyn Bruins, MD  loratadine (CLARITIN) 10 MG tablet Take 1 tablet (10 mg total) by mouth daily. 09/15/16   Caccavale, Sophia, PA-C  montelukast (SINGULAIR) 10 MG tablet TAKE 1 TABLET BY MOUTH EVERYDAY AT BEDTIME 03/27/17   Marcelyn Bruins, MD  Naproxen Sodium (ALEVE) 220 MG CAPS Take by mouth. Take as needed for menstrual cramps    [provider]  Norethindrone Acetate-Ethinyl Estradiol (JUNEL,LOESTRIN,MICROGESTIN) 1.5-30 MG-MCG tablet Take 1 tablet by mouth daily. 08/07/11 08/06/12  Sandford Craze, NP    Family History Family History  Problem Relation Age of Onset  . Hyperlipidemia Father     Social History Social History   Tobacco Use  . Smoking status: Never Smoker  . Smokeless tobacco: Never Used  Substance Use Topics  . Alcohol use: No  . Drug use: No     Allergies   Gramineae pollens; Peanut-containing drug products; and Zantac [ranitidine hcl]   Review of Systems Review of Systems  Musculoskeletal: Positive for joint swelling and myalgias.  Skin: Positive for wound. Negative for color change.  Allergic/Immunologic: Negative for  immunocompromised state.  Neurological: Negative for weakness and numbness.  Hematological: Does not bruise/bleed easily.  Psychiatric/Behavioral: Negative for confusion.     Physical Exam Updated Vital Signs BP 132/87 (BP Location: Left Arm)   Pulse 82   Temp 98.7 F (37.1 C) (Oral)   Resp 18   Ht 5\' 1"  (1.549 m)   Wt 93.4 kg (206 lb)   LMP 08/16/2017   SpO2 100%   BMI 38.92 kg/m   Physical Exam  Constitutional: She is oriented to person, place, and time. Vital signs are normal. She appears well-developed  and well-nourished.  Non-toxic appearance. No distress.  Afebrile, nontoxic, NAD  HENT:  Head: Normocephalic and atraumatic.  Mouth/Throat: Mucous membranes are normal.  Eyes: Conjunctivae and EOM are normal. Right eye exhibits no discharge. Left eye exhibits no discharge.  Neck: Normal range of motion. Neck supple.  Cardiovascular: Normal rate and intact distal pulses.  Pulmonary/Chest: Effort normal. No respiratory distress.  Abdominal: Normal appearance. She exhibits no distension.  Musculoskeletal: Normal range of motion.       Right hand: She exhibits laceration. She exhibits normal range of motion, no tenderness, no bony tenderness, normal capillary refill, no deformity and no swelling. Normal sensation noted. Normal strength noted.  Small ~2cm in length skin avulsion wound to dorsal aspect of R hand, with missing flap of skin, very superficial, no ongoing bleeding and no retained FBs, no deep wounds. No focal bony or joint line TTP, no crepitus or deformity, no bruising or swelling. Strength and sensation grossly intact, distal pulses intact, compartments soft. SEE PICTURE BELOW  Neurological: She is alert and oriented to person, place, and time. She has normal strength. No sensory deficit.  Skin: Skin is warm, dry and intact. No rash noted.  Psychiatric: She has a normal mood and affect. Her behavior is normal.  Nursing note and vitals reviewed.      ED Treatments / Results  Labs (all labs ordered are listed, but only abnormal results are displayed) Labs Reviewed - No data to display  EKG None  Radiology No results found.  Procedures Procedures (including critical care time)  Medications Ordered in ED Medications  lidocaine (XYLOCAINE) 1 % (with pres) injection (has no administration in time range)  bacitracin ointment (has no administration in time range)     Initial Impression / Assessment and Plan / ED Course  I have reviewed the triage vital signs and the  nursing notes.  Pertinent labs & imaging results that were available during my care of the patient were reviewed by me and considered in my medical decision making (see chart for details).     22 y.o. female here with skin avulsion wound from broken glass. On exam, small ~2cm wound which is fairly superficial with skin flap missing, no ongoing bleeding, no retained FBs, no deep wounds, FROM intact in all digits, no focal bony TTP, NVI with soft compartments. No wound to really repair, and bleeding controlled, doubt need for suture intervention or wound seal use, will allow wound to heal via secondary intent. Doubt need for imaging. Pt UTD on vaccines. Advised tylenol/motrin for pain, use of ice, and discussed proper wound care. Doubt need for ppx abx, however pt wants to go home with abx rx in case s/sx of infection develop "so she doesn't have to come back". I will give her a safety script, however advised that if she develops s/sx of infection then she would need repeat evaluation. F/up with PCP in 5-7  days for wound check. I explained the diagnosis and have given explicit precautions to return to the ER including for any other new or worsening symptoms. The patient understands and accepts the medical plan as it's been dictated and I have answered their questions. Discharge instructions concerning home care and prescriptions have been given. The patient is STABLE and is discharged to home in good condition.    Final Clinical Impressions(s) / ED Diagnoses   Final diagnoses:  Laceration of right hand without foreign body, initial encounter    ED Discharge Orders        Ordered    amoxicillin-clavulanate (AUGMENTIN) 875-125 MG tablet  2 times daily     08/27/17 [address]       Aaren Atallah, [place], [place] JerseyPA-C 08/27/17 2259    Loren RacerYelverton, David, MD 08/27/17 2344

## 2017-10-22 ENCOUNTER — Other Ambulatory Visit: Payer: Self-pay | Admitting: Allergy

## 2017-10-22 DIAGNOSIS — T783XXD Angioneurotic edema, subsequent encounter: Secondary | ICD-10-CM

## 2017-10-22 DIAGNOSIS — L508 Other urticaria: Secondary | ICD-10-CM

## 2017-11-23 ENCOUNTER — Other Ambulatory Visit: Payer: Self-pay | Admitting: Allergy

## 2017-11-23 DIAGNOSIS — T783XXD Angioneurotic edema, subsequent encounter: Secondary | ICD-10-CM

## 2017-11-23 DIAGNOSIS — L508 Other urticaria: Secondary | ICD-10-CM

## 2017-12-25 ENCOUNTER — Other Ambulatory Visit: Payer: Self-pay | Admitting: Allergy

## 2017-12-25 DIAGNOSIS — T783XXD Angioneurotic edema, subsequent encounter: Secondary | ICD-10-CM

## 2017-12-25 DIAGNOSIS — L508 Other urticaria: Secondary | ICD-10-CM

## 2017-12-25 NOTE — Telephone Encounter (Signed)
Courtesy Refill

## 2018-01-23 ENCOUNTER — Other Ambulatory Visit: Payer: Self-pay

## 2018-01-23 NOTE — Telephone Encounter (Signed)
Refill requested for famotidine tablets. Patient has not been seen in over a year and was due back 11-2016. I am faxing a denial back now.

## 2018-01-24 ENCOUNTER — Other Ambulatory Visit: Payer: Self-pay | Admitting: Allergy

## 2018-01-24 DIAGNOSIS — L508 Other urticaria: Secondary | ICD-10-CM

## 2018-01-24 DIAGNOSIS — T783XXD Angioneurotic edema, subsequent encounter: Secondary | ICD-10-CM

## 2018-02-09 ENCOUNTER — Other Ambulatory Visit: Payer: Self-pay | Admitting: Allergy

## 2018-02-09 DIAGNOSIS — T783XXD Angioneurotic edema, subsequent encounter: Secondary | ICD-10-CM

## 2018-02-09 DIAGNOSIS — L508 Other urticaria: Secondary | ICD-10-CM

## 2018-09-28 ENCOUNTER — Other Ambulatory Visit: Payer: Self-pay

## 2018-09-28 ENCOUNTER — Encounter (HOSPITAL_BASED_OUTPATIENT_CLINIC_OR_DEPARTMENT_OTHER): Payer: Self-pay | Admitting: Emergency Medicine

## 2018-09-28 ENCOUNTER — Emergency Department (HOSPITAL_BASED_OUTPATIENT_CLINIC_OR_DEPARTMENT_OTHER)
Admission: EM | Admit: 2018-09-28 | Discharge: 2018-09-28 | Disposition: A | Discharge status: Home / Self Care | Payer: BC Managed Care – PPO | Attending: Emergency Medicine | Admitting: Emergency Medicine

## 2018-09-28 DIAGNOSIS — K6289 Other specified diseases of anus and rectum: Secondary | ICD-10-CM | POA: Diagnosis present

## 2018-09-28 DIAGNOSIS — K644 Residual hemorrhoidal skin tags: Secondary | ICD-10-CM

## 2018-09-28 DIAGNOSIS — K649 Unspecified hemorrhoids: Secondary | ICD-10-CM | POA: Diagnosis not present

## 2018-09-28 NOTE — ED Provider Notes (Signed)
[facility] EMERGENCY DEPARTMENT Provider Note   CSN: [id number] Arrival date & time: 09/28/18  1234    History   Chief Complaint Chief Complaint  Patient presents with  . rectal pain    HPI Ninetta LightsKewana Pinkus is a 23 y.o. female.     23 y.o female with no PMH presents to the ED with a chief complaint of rectal pain x 4 days. Patient had a Telemedicine visit this morning and was advised to be seen in the ED for a possible rectal abscess. She reports she first felt this pain after a bowel movement, reports is painful to have a bowel movement as she reports once the stool touches the outside layer makes it even more painful.  She reports using Preparation H without improvement.  Patient does endorse some blood in her stool.  Reports she was constipated at first but now has had diarrhea.  She reports a similar episode to this in the past, no therapy was done for it and it improved on its own.  She denies any fevers, nausea, vomiting, other complaints.  The history is provided by the patient.    Past Medical History:  Diagnosis Date  . Urticaria     Patient Active Problem List   Diagnosis Date Noted  . Vaginal discharge 04/27/2011  . General medical examination 04/13/2011  . Contraceptive management 04/13/2011  . Dysuria 04/13/2011  . Bleeding hemorrhoid 11/10/2010  . Dysmenorrhea 11/10/2010    History reviewed. No pertinent surgical history.   OB History   No obstetric history on file.      Home Medications    Prior to Admission medications   Medication Sig Start Date End Date Taking? Authorizing Provider  amoxicillin-clavulanate (AUGMENTIN) 875-125 MG tablet Take 1 tablet by mouth 2 (two) times daily. One po bid x 7 days. ONLY START TAKING THIS IF SIGNS OF INFECTION OCCUR 08/27/17   Street, SesserMercedes, PA-C  cetirizine (ZYRTEC) 10 MG tablet Take 1 tablet (10 mg total) by mouth 2 (two) times daily. 10/11/16   Marcelyn BruinsPadgett, Shaylar Patricia, MD  famotidine (PEPCID) 20 MG  tablet TAKE 1 TABLET BY MOUTH TWICE A DAY 03/27/17   Marcelyn BruinsPadgett, Shaylar Patricia, MD  famotidine (PEPCID) 20 MG tablet TAKE 1 TABLET BY MOUTH TWICE A DAY 07/30/17   Marcelyn BruinsPadgett, Shaylar Patricia, MD  famotidine (PEPCID) 20 MG tablet TAKE 1 TABLET BY MOUTH TWICE A DAY *INSURANCE WILL ONLY FILL 90 DAY* 12/25/17   Marcelyn BruinsPadgett, Shaylar Patricia, MD  loratadine (CLARITIN) 10 MG tablet Take 1 tablet (10 mg total) by mouth daily. 09/15/16   Caccavale, Sophia, PA-C  montelukast (SINGULAIR) 10 MG tablet TAKE 1 TABLET BY MOUTH EVERYDAY AT BEDTIME 03/27/17   Marcelyn BruinsPadgett, Shaylar Patricia, MD  Naproxen Sodium (ALEVE) 220 MG CAPS Take by mouth. Take as needed for menstrual cramps    [provider]  Norethindrone Acetate-Ethinyl Estradiol (JUNEL,LOESTRIN,MICROGESTIN) 1.5-30 MG-MCG tablet Take 1 tablet by mouth daily. 08/07/11 08/06/12  Sandford Craze'Sullivan, Melissa, NP    Family History Family History  Problem Relation Age of Onset  . Hyperlipidemia Father     Social History Social History   Tobacco Use  . Smoking status: Never Smoker  . Smokeless tobacco: Never Used  Substance Use Topics  . Alcohol use: Yes  . Drug use: No     Allergies   Gramineae pollens, Peanut-containing drug products, and Zantac [ranitidine hcl]   Review of Systems Review of Systems  Constitutional: Negative for fever.  Respiratory: Negative for shortness of breath.  Cardiovascular: Negative for chest pain.  Gastrointestinal: Positive for blood in stool and constipation. Negative for abdominal pain, nausea and vomiting.  Genitourinary: Negative for flank pain.  Musculoskeletal: Negative for back pain.  Skin: Negative for color change, pallor, rash and wound.  Neurological: Negative for light-headedness.     Physical Exam Updated Vital Signs BP (!) 138/91 (BP Location: Left Arm)   Pulse 92   Temp 99.2 F (37.3 C) (Oral)   Resp 18   Ht 5\' 1"  (1.549 m)   Wt 90.7 kg   LMP 09/14/2018   SpO2 99%   BMI 37.79 kg/m   Physical Exam  Vitals signs and nursing note reviewed.  Constitutional:      General: She is not in acute distress.    Appearance: Normal appearance. She is well-developed.  HENT:     Head: Normocephalic and atraumatic.     Mouth/Throat:     Pharynx: No oropharyngeal exudate.  Eyes:     Pupils: Pupils are equal, round, and reactive to light.  Neck:     Musculoskeletal: Normal range of motion.  Cardiovascular:     Rate and Rhythm: Regular rhythm.     Heart sounds: Normal heart sounds.  Pulmonary:     Effort: Pulmonary effort is normal. No respiratory distress.     Breath sounds: Normal breath sounds.  Abdominal:     General: Bowel sounds are normal. There is no distension.     Palpations: Abdomen is soft.     Tenderness: There is no abdominal tenderness.  Genitourinary:    Exam position: Knee-chest position.     Rectum: Tenderness and external hemorrhoid present. No internal hemorrhoid.     Comments: Thrombosed external hemorrhoid present surrounding anus @3  o'clock. No tenderness around, no erythema, no streaking.  Musculoskeletal:        General: No tenderness or deformity.     Right lower leg: No edema.     Left lower leg: No edema.  Skin:    General: Skin is warm and dry.  Neurological:     Mental Status: She is alert and oriented to person, place, and time.      ED Treatments / Results  Labs (all labs ordered are listed, but only abnormal results are displayed) Labs Reviewed - No data to display  EKG None  Radiology No results found.  Procedures Procedures (including critical care time)  Medications Ordered in ED Medications - No data to display   Initial Impression / Assessment and Plan / ED Course  I have reviewed the triage vital signs and the nursing notes.  Pertinent labs & imaging results that were available during my care of the patient were reviewed by me and considered in my medical decision making (see chart for details).      23 year old female with a  past medical history of hemorrhoids presents to the ED with complaints of rectal pain which began 3 to 4 days ago.  Patient does report some blood in her stool, states this is more painful after she has a bowel movement.  She does have a previous history of hemorrhoids when she was 23 years old according to her chart which I have personally review.  Patient did have a telehealth visit this morning, was told that she needed to come to the ED for a I&D of her rectal abscess, this is however not abscess, during my evaluation there is no surrounding erythema, streaking, changes in the skin.  Hemorrhoid is localized outside anus,  patient is nontoxic-appearing, afebrile while in the ED.  We will have her follow-up with [facility] surgery if need be, patient is advised to continue treatment with Preparation H along with warm soaks.  Return precautions provided at length.   Portions of this note were generated with Lobby[employer]. Dictation errors may occur despite best attempts at proofreading.   Final Clinical Impressions(s) / ED Diagnoses   Final diagnoses:  External hemorrhoids    ED Discharge Orders    None       Janeece Fitting, PA-C 09/28/18 [place], April, MD 09/28/18 1349

## 2018-09-28 NOTE — Discharge Instructions (Addendum)
Please continue using Preparation H, you may also continue warm soaks.  We have discussed a diet higher in fiber along with MiraLAX to help with your bowel movements.  Please follow-up with your primary care physician as needed.  A phone number to general surgery has been attached to your chart, please schedule an appointment if needed for hemorrhoid removal.

## 2018-09-28 NOTE — ED Triage Notes (Signed)
C/o rectal pain x 4 days. Concerned about an abscess.

## 2022-12-17 ENCOUNTER — Encounter (HOSPITAL_COMMUNITY): Payer: Self-pay | Admitting: *Deleted

## 2022-12-17 ENCOUNTER — Ambulatory Visit (HOSPITAL_COMMUNITY)
Admission: EM | Admit: 2022-12-17 | Discharge: 2022-12-17 | Disposition: A | Discharge status: Home / Self Care | Payer: BC Managed Care – PPO | Attending: Family Medicine | Admitting: Family Medicine

## 2022-12-17 DIAGNOSIS — Z113 Encounter for screening for infections with a predominantly sexual mode of transmission: Secondary | ICD-10-CM | POA: Diagnosis not present

## 2022-12-17 DIAGNOSIS — R102 Pelvic and perineal pain unspecified side: Secondary | ICD-10-CM

## 2022-12-17 DIAGNOSIS — R5383 Other fatigue: Secondary | ICD-10-CM | POA: Diagnosis present

## 2022-12-17 DIAGNOSIS — N939 Abnormal uterine and vaginal bleeding, unspecified: Secondary | ICD-10-CM

## 2022-12-17 LAB — CBC
HCT: 38.3 % (ref 36.0–46.0)
Hemoglobin: 12.5 g/dL (ref 12.0–15.0)
MCH: 28.5 pg (ref 26.0–34.0)
MCHC: 32.6 g/dL (ref 30.0–36.0)
MCV: 87.2 fL (ref 80.0–100.0)
Platelets: 250 10*3/uL (ref 150–400)
RBC: 4.39 MIL/uL (ref 3.87–5.11)
RDW: 13.2 % (ref 11.5–15.5)
WBC: 5 10*3/uL (ref 4.0–10.5)
nRBC: 0 % (ref 0.0–0.2)

## 2022-12-17 LAB — POCT URINALYSIS DIP (MANUAL ENTRY)
Glucose, UA: NEGATIVE mg/dL
Ketones, POC UA: NEGATIVE mg/dL
Leukocytes, UA: NEGATIVE
Nitrite, UA: NEGATIVE
Protein Ur, POC: 100 mg/dL — AB
Spec Grav, UA: >=1.03 — AB (ref 1.010–1.025)
Urobilinogen, UA: 0.2 U/dL
pH, UA: 5 (ref 5.0–8.0)

## 2022-12-17 LAB — POCT URINE PREGNANCY: Preg Test, Ur: NEGATIVE

## 2022-12-17 LAB — TSH: TSH: 3.271 u[IU]/mL (ref 0.350–4.500)

## 2022-12-17 MED ORDER — MEFENAMIC ACID 250 MG PO CAPS: 500.0000 mg | ORAL_CAPSULE | Freq: Three times a day (TID) | ORAL | 0 refills | Status: AC

## 2022-12-17 NOTE — Discharge Instructions (Signed)
The pregnancy test was negative  The urinalysis just showed red blood cells consistent with your vaginal bleeding.  We have drawn blood to check your blood counts/for anemia and to check your thyroid function.  Staff will notify you if anything is significantly abnormal.  Staff will notify you if there is anything positive on the swab.  Mefenamic acid 2 and 50 mg--2 capsules by mouth 3 times daily for up to 3 days to make your vaginal bleeding better. Call your doctors tomorrow to follow-up with them about this issue.  If you become dizzy or weak or or your bleeding is even more brisk, then please proceed to the emergency room for further evaluation

## 2022-12-17 NOTE — ED Provider Notes (Addendum)
[facility]    CSN: [id number] Arrival date & time: 12/17/22  1649      History   Chief Complaint Chief Complaint  Patient presents with   Abdominal Pain   Fatigue    HPI Renee Harrison is a 27 y.o. female.    Abdominal Pain Here for heavy vaginal bleeding, pelvic pain, and fatigue.  On October 26 she began having pelvic pain/cramps.  She is also been very tired and feeling cold since this started.  No fever or chills and no nausea or vomiting.  Bowel movements are normal   Prior to this she had not had a menstrual cycle since April 2023.  She had been evaluated by her doctors and had lab work in March of this year that was all negative.  No dysuria Past Medical History:  Diagnosis Date   Urticaria     Patient Active Problem List   Diagnosis Date Noted   Vaginal discharge 04/27/2011   General medical examination 04/13/2011   Contraceptive management 04/13/2011   Dysuria 04/13/2011   Bleeding hemorrhoid 11/10/2010   Dysmenorrhea 11/10/2010    History reviewed. No pertinent surgical history.  OB History   No obstetric history on file.      Home Medications    Prior to Admission medications   Medication Sig Start Date End Date Taking? Authorizing Provider  Mefenamic Acid 250 MG CAPS Take 2 capsules (500 mg total) by mouth in the morning, at noon, and at bedtime for 3 days. 12/17/22 12/20/22 Yes Zenia Resides, MD  cetirizine (ZYRTEC) 10 MG tablet Take 1 tablet (10 mg total) by mouth 2 (two) times daily. 10/11/16   Marcelyn Bruins, MD  loratadine (CLARITIN) 10 MG tablet Take 1 tablet (10 mg total) by mouth daily. 09/15/16   Caccavale, Sophia, PA-C  montelukast (SINGULAIR) 10 MG tablet TAKE 1 TABLET BY MOUTH EVERYDAY AT BEDTIME 03/27/17   Marcelyn Bruins, MD    Family History Family History  Problem Relation Age of Onset   Hyperlipidemia Father     Social History Social History   Tobacco Use   Smoking status: Never    Smokeless tobacco: Never  Vaping Use   Vaping status: Never Used  Substance Use Topics   Alcohol use: Yes   Drug use: No     Allergies   Gramineae pollens, Peanut-containing drug products, and Zantac [ranitidine hcl]   Review of Systems Review of Systems  Gastrointestinal:  Positive for abdominal pain.     Physical Exam Triage Vital Signs ED Triage Vitals  Encounter Vitals Group     BP 12/17/22 1743 117/80     Systolic BP Percentile --      Diastolic BP Percentile --      Pulse Rate 12/17/22 1743 74     Resp 12/17/22 1743 18     Temp 12/17/22 1743 98.2 F (36.8 C)     Temp Source 12/17/22 1743 Oral     SpO2 12/17/22 1743 95 %     Weight --      Height --      Head Circumference --      Peak Flow --      Pain Score 12/17/22 1741 8     Pain Loc --      Pain Education --      Exclude from Growth Chart --    No data found.  Updated Vital Signs BP 117/80 (BP Location: Right Arm)   Pulse 74  Temp 98.2 F (36.8 C) (Oral)   Resp 18   LMP 12/15/2022 (Exact Date)   SpO2 95%   Visual Acuity Right Eye Distance:   Left Eye Distance:   Bilateral Distance:    Right Eye Near:   Left Eye Near:    Bilateral Near:     Physical Exam Vitals reviewed.  Constitutional:      General: She is not in acute distress.    Appearance: She is not ill-appearing, toxic-appearing or diaphoretic.  HENT:     Mouth/Throat:     Mouth: Mucous membranes are moist.  Eyes:     Extraocular Movements: Extraocular movements intact.     Conjunctiva/sclera: Conjunctivae normal.     Pupils: Pupils are equal, round, and reactive to light.  Cardiovascular:     Rate and Rhythm: Normal rate and regular rhythm.  Pulmonary:     Breath sounds: Normal breath sounds.  Abdominal:     Palpations: Abdomen is soft.     Tenderness: There is abdominal tenderness (suprapubic and blq).  Musculoskeletal:     Cervical back: Neck supple.  Lymphadenopathy:     Cervical: No cervical adenopathy.   Skin:    Coloration: Skin is not jaundiced or pale.  Neurological:     General: No focal deficit present.     Mental Status: She is alert and oriented to person, place, and time.  Psychiatric:        Behavior: Behavior normal.      UC Treatments / Results  Labs (all labs ordered are listed, but only abnormal results are displayed) Labs Reviewed  POCT URINALYSIS DIP (MANUAL ENTRY) - Abnormal; Notable for the following components:      Result Value   Color, UA red (*)    Clarity, UA cloudy (*)    Bilirubin, UA small (*)    Spec Grav, UA >=1.030 (*)    Blood, UA large (*)    Protein Ur, POC =100 (*)    All other components within normal limits  CBC  TSH  POCT URINE PREGNANCY  CERVICOVAGINAL ANCILLARY ONLY    EKG   Radiology No results found.  Procedures Procedures (including critical care time)  Medications Ordered in UC Medications - No data to display  Initial Impression / Assessment and Plan / UC Course  I have reviewed the triage vital signs and the nursing notes.  Pertinent labs & imaging results that were available during my care of the patient were reviewed by me and considered in my medical decision making (see chart for details).     UPT is negative.  Urinalysis shows large amount of blood but no leukocytes and no nitrites.  Vaginal self swab is done, and we will notify of any positives on that and treat per protocol.  CBC and TSH are drawn today to address her complaints.  Will notify her if anything is significantly abnormal.  Mefenamic acid is sent in to try to help her bleeding.  She will contact her doctors tomorrow to follow-up with them  Final Clinical Impressions(s) / UC Diagnoses   Final diagnoses:  Abnormal uterine bleeding  Pelvic pain  Fatigue, unspecified type     Discharge Instructions      The pregnancy test was negative  The urinalysis just showed red blood cells consistent with your vaginal bleeding.  We have drawn  blood to check your blood counts/for anemia and to check your thyroid function.  Staff will notify you if anything is significantly abnormal.  Staff will notify you if there is anything positive on the swab.  Mefenamic acid 2 and 50 mg--2 capsules by mouth 3 times daily for up to 3 days to make your vaginal bleeding better. Call your doctors tomorrow to follow-up with them about this issue.  If you become dizzy or weak or or your bleeding is even more brisk, then please proceed to the emergency room for further evaluation     ED Prescriptions     Medication Sig Dispense Auth. Provider   Mefenamic Acid 250 MG CAPS Take 2 capsules (500 mg total) by mouth in the morning, at noon, and at bedtime for 3 days. 18 capsule Marlinda Mike Janace Aris, MD      PDMP not reviewed this encounter.   Zenia Resides, MD 12/17/22 1815    Zenia Resides, MD 12/17/22 [id number]

## 2022-12-17 NOTE — ED Triage Notes (Signed)
Pt states she has had abdominal pain since Saturday, she is fatigued. She states she has been taking IBU over the weekend. She states that she had no menstrual cycle since 05/2021 until Saturday when she started bleeding a lot.

## 2022-12-18 LAB — CERVICOVAGINAL ANCILLARY ONLY
Bacterial Vaginitis (gardnerella): POSITIVE — AB
Candida Glabrata: NEGATIVE
Candida Vaginitis: NEGATIVE
Chlamydia: NEGATIVE
Comment: NEGATIVE
Comment: NEGATIVE
Comment: NEGATIVE
Comment: NEGATIVE
Comment: NEGATIVE
Comment: NORMAL
Neisseria Gonorrhea: NEGATIVE
Trichomonas: NEGATIVE

## 2022-12-19 ENCOUNTER — Telehealth: Payer: Self-pay

## 2022-12-19 MED ORDER — METRONIDAZOLE 500 MG PO TABS: 500.0000 mg | ORAL_TABLET | Freq: Two times a day (BID) | ORAL | 0 refills | Status: AC

## 2022-12-19 NOTE — Telephone Encounter (Signed)
Per protocol, pt requires tx with metronidazole. Reviewed with patient, verified pharmacy, prescription sent.

## 2023-11-03 ENCOUNTER — Ambulatory Visit
Admission: EM | Admit: 2023-11-03 | Discharge: 2023-11-03 | Disposition: A | Discharge status: Home / Self Care | Attending: Family Medicine | Admitting: Family Medicine

## 2023-11-03 DIAGNOSIS — R21 Rash and other nonspecific skin eruption: Secondary | ICD-10-CM | POA: Insufficient documentation

## 2023-11-03 DIAGNOSIS — Z113 Encounter for screening for infections with a predominantly sexual mode of transmission: Secondary | ICD-10-CM | POA: Insufficient documentation

## 2023-11-03 MED ORDER — HYDROXYZINE HCL 25 MG PO TABS
12.5000 mg | ORAL_TABLET | Freq: Three times a day (TID) | ORAL | 0 refills | Status: AC | PRN
Start: 2023-11-03 — End: ?

## 2023-11-03 MED ORDER — PREDNISONE 10 MG PO TABS: 30.0000 mg | ORAL_TABLET | Freq: Every day | ORAL | 0 refills | Status: AC

## 2023-11-03 NOTE — Discharge Instructions (Addendum)
 We will update you on your test results tomorrow and treat any infections you test positive for. Otherwise, start prednisone  for a suspected reactive/allergic rash. Use hydroxyzine  for itching. Avoid and eliminate new exposures.

## 2023-11-03 NOTE — ED Provider Notes (Signed)
 [facility]  Note:  This document was prepared using Conservation officer, historic buildings and may include unintentional dictation errors.  MRN: [id number] DOB: 06/14/1995  Subjective:   Renee Harrison is a 28 y.o. female presenting for 2-day history of persistent itchy rash over the arms spreading toward the chest and feeling itching over her lower legs.  Has a history of urticaria and allergic reactions.  Denies eating any new foods, starting new medications, exposure to poisonous plants, new hygiene products, new cleaning products or detergents.  Would also like STI testing as a routine check.  No shortness of breath, facial or oral swelling, nausea, vomiting, abdominal pain.  No current facility-administered medications for this encounter.  Current Outpatient Medications:    cetirizine  (ZYRTEC ) 10 MG tablet, Take 1 tablet (10 mg total) by mouth 2 (two) times daily., Disp: 60 tablet, Rfl: 5   loratadine  (CLARITIN ) 10 MG tablet, Take 1 tablet (10 mg total) by mouth daily., Disp: 30 tablet, Rfl: 0   montelukast  (SINGULAIR ) 10 MG tablet, TAKE 1 TABLET BY MOUTH EVERYDAY AT BEDTIME, Disp: 30 tablet, Rfl: 0   Allergies  Allergen Reactions   Gramineae Pollens Hives   Peanut-Containing Drug Products    Zantac  [Ranitidine  Hcl]     Past Medical History:  Diagnosis Date   Urticaria      No past surgical history on file.  Family History  Problem Relation Age of Onset   Hyperlipidemia Father     Social History   Tobacco Use   Smoking status: Never   Smokeless tobacco: Never  Vaping Use   Vaping status: Never Used  Substance Use Topics   Alcohol use: Yes   Drug use: No    ROS   Objective:   Vitals: There were no vitals taken for this visit.  Physical Exam Constitutional:      General: She is not in acute distress.    Appearance: Normal appearance. She is well-developed. She is not ill-appearing, toxic-appearing or diaphoretic.  HENT:     Head:  Normocephalic and atraumatic.     Nose: Nose normal.     Mouth/Throat:     Mouth: Mucous membranes are moist.     Pharynx: No pharyngeal swelling, oropharyngeal exudate, posterior oropharyngeal erythema or uvula swelling.     Tonsils: No tonsillar exudate or tonsillar abscesses. 0 on the right. 0 on the left.  Eyes:     General: No scleral icterus.       Right eye: No discharge.        Left eye: No discharge.     Extraocular Movements: Extraocular movements intact.  Cardiovascular:     Rate and Rhythm: Normal rate and regular rhythm.     Heart sounds: Normal heart sounds. No murmur heard.    No friction rub. No gallop.  Pulmonary:     Effort: Pulmonary effort is normal. No respiratory distress.     Breath sounds: No stridor. No wheezing, rhonchi or rales.  Chest:     Chest wall: No tenderness.  Skin:    General: Skin is warm and dry.     Findings: Rash (mild macular papular rash over both upper extremities and to a lesser degree the mid chest) present.  Neurological:     General: No focal deficit present.     Mental Status: She is alert and oriented to person, place, and time.  Psychiatric:        Mood and Affect: Mood normal.  Behavior: Behavior normal.     Assessment and Plan :   PDMP not reviewed this encounter.  1. Rash and nonspecific skin eruption   2. Screen for STD (sexually transmitted disease)    STI check pending.  I do suspect that this rash is reactive, allergic in nature.  Recommended a oral prednisone  course, hydroxyzine  for itching.  Eliminate/avoid new exposures of much as possible.  No sign of anaphylaxis.  Counseled patient on potential for adverse effects with medications prescribed/recommended today, ER and return-to-clinic precautions discussed, patient verbalized understanding.    Christopher Savannah, [place] 11/03/23 1456

## 2023-11-03 NOTE — ED Triage Notes (Addendum)
 Pt c/o itchy rash to both arms and chest that began Friday. The patient states she has not changed any items used. The patient denies [facility].    The patient is wanting to be tested for STDs (including blood work), pt denies symptoms.    Home interventions: peroxide to areas, cetirazine (Friday)

## 2023-11-05 LAB — CERVICOVAGINAL ANCILLARY ONLY
Chlamydia: NEGATIVE
Comment: NEGATIVE
Comment: NEGATIVE
Comment: NORMAL
Neisseria Gonorrhea: NEGATIVE
Trichomonas: NEGATIVE

## 2023-11-05 LAB — HIV ANTIBODY (ROUTINE TESTING W REFLEX): HIV Screen 4th Generation wRfx: NONREACTIVE

## 2023-11-05 LAB — RPR: RPR Ser Ql: NONREACTIVE

## 2024-03-03 ENCOUNTER — Other Ambulatory Visit: Payer: Self-pay

## 2024-03-03 ENCOUNTER — Encounter (HOSPITAL_BASED_OUTPATIENT_CLINIC_OR_DEPARTMENT_OTHER): Payer: Self-pay

## 2024-03-03 ENCOUNTER — Emergency Department (HOSPITAL_BASED_OUTPATIENT_CLINIC_OR_DEPARTMENT_OTHER)

## 2024-03-03 ENCOUNTER — Emergency Department (HOSPITAL_BASED_OUTPATIENT_CLINIC_OR_DEPARTMENT_OTHER)
Admission: EM | Admit: 2024-03-03 | Discharge: 2024-03-03 | Disposition: A | Discharge status: Home / Self Care | Attending: Emergency Medicine | Admitting: Emergency Medicine

## 2024-03-03 DIAGNOSIS — R1084 Generalized abdominal pain: Secondary | ICD-10-CM | POA: Diagnosis present

## 2024-03-03 DIAGNOSIS — Z91010 Allergy to peanuts: Secondary | ICD-10-CM | POA: Diagnosis not present

## 2024-03-03 DIAGNOSIS — K921 Melena: Secondary | ICD-10-CM | POA: Diagnosis not present

## 2024-03-03 DIAGNOSIS — R109 Unspecified abdominal pain: Secondary | ICD-10-CM

## 2024-03-03 LAB — CBC
HCT: 39 % (ref 36.0–46.0)
Hemoglobin: 12.9 g/dL (ref 12.0–15.0)
MCH: 29.7 pg (ref 26.0–34.0)
MCHC: 33.1 g/dL (ref 30.0–36.0)
MCV: 89.7 fL (ref 80.0–100.0)
Platelets: 234 K/uL (ref 150–400)
RBC: 4.35 MIL/uL (ref 3.87–5.11)
RDW: 12.5 % (ref 11.5–15.5)
WBC: 6 K/uL (ref 4.0–10.5)
nRBC: 0 % (ref 0.0–0.2)

## 2024-03-03 LAB — COMPREHENSIVE METABOLIC PANEL WITH GFR
ALT: 19 U/L (ref 0–44)
AST: 19 U/L (ref 15–41)
Albumin: 4 g/dL (ref 3.5–5.0)
Alkaline Phosphatase: 95 U/L (ref 38–126)
Anion gap: 9 (ref 5–15)
BUN: 15 mg/dL (ref 6–20)
CO2: 27 mmol/L (ref 22–32)
Calcium: 9.1 mg/dL (ref 8.9–10.3)
Chloride: 108 mmol/L (ref 98–111)
Creatinine, Ser: 0.96 mg/dL (ref 0.44–1.00)
GFR, Estimated: >60 mL/min
Glucose, Bld: 100 mg/dL — ABNORMAL HIGH (ref 70–99)
Potassium: 4.2 mmol/L (ref 3.5–5.1)
Sodium: 143 mmol/L (ref 135–145)
Total Bilirubin: <0.2 mg/dL (ref 0.0–1.2)
Total Protein: 6.5 g/dL (ref 6.5–8.1)

## 2024-03-03 LAB — URINALYSIS, ROUTINE W REFLEX MICROSCOPIC
Bilirubin Urine: NEGATIVE
Glucose, UA: NEGATIVE mg/dL
Hgb urine dipstick: NEGATIVE
Ketones, ur: NEGATIVE mg/dL
Leukocytes,Ua: NEGATIVE
Nitrite: NEGATIVE
Protein, ur: NEGATIVE mg/dL
Specific Gravity, Urine: 1.015 (ref 1.005–1.030)
pH: 6.5 (ref 5.0–8.0)

## 2024-03-03 LAB — PREGNANCY, URINE: Preg Test, Ur: NEGATIVE

## 2024-03-03 LAB — LIPASE, BLOOD: Lipase: 59 U/L — ABNORMAL HIGH (ref 11–51)

## 2024-03-03 MED ORDER — DICYCLOMINE HCL 20 MG PO TABS: 20.0000 mg | ORAL_TABLET | Freq: Two times a day (BID) | ORAL | 0 refills | Status: AC

## 2024-03-03 MED ORDER — IOHEXOL 300 MG/ML  SOLN
100.0000 mL | Freq: Once | INTRAMUSCULAR | Status: AC | PRN
  Administered 2024-03-03: 100 mL via INTRAVENOUS

## 2024-03-03 MED ORDER — ONDANSETRON HCL 4 MG PO TABS: 4.0000 mg | ORAL_TABLET | Freq: Four times a day (QID) | ORAL | 0 refills | Status: AC

## 2024-03-03 NOTE — ED Provider Notes (Signed)
 " [facility] AT [facility] Provider Note   CSN: [id number] Arrival date & time: 03/03/24  1716     Patient presents with: Abdominal Pain   Renee Harrison is a 29 y.o. female.who presents for abdominal pain and cramping for about 2 months. Patient reports generalized cramping. She denies any constipation. She reports that sometimes she passes blood and mucus. She nausea, vomiting, distension, urinary sxs, vaginal sxs.  He has no history of inflammatory bowel disease and no family history of inflammatory bowel disease    Abdominal Pain      Prior to Admission medications  Medication Sig Start Date End Date Taking? Authorizing Provider  cetirizine  (ZYRTEC ) 10 MG tablet Take 1 tablet (10 mg total) by mouth 2 (two) times daily. 10/11/16   Jeneal Danita Macintosh, MD  hydrOXYzine  (ATARAX ) 25 MG tablet Take 0.5-1 tablets (12.5-25 mg total) by mouth every 8 (eight) hours as needed for itching. 11/03/23   Christopher Savannah, PA-C  loratadine  (CLARITIN ) 10 MG tablet Take 1 tablet (10 mg total) by mouth daily. 09/15/16   Caccavale, Sophia, PA-C  montelukast  (SINGULAIR ) 10 MG tablet TAKE 1 TABLET BY MOUTH EVERYDAY AT BEDTIME 03/27/17   Jeneal Danita Macintosh, MD  predniSONE  (DELTASONE ) 10 MG tablet Take 3 tablets (30 mg total) by mouth daily with breakfast. 11/03/23   Christopher Savannah, PA-C    Allergies: Gramineae pollens, Peanut-containing drug products, and Zantac  [ranitidine  hcl]    Review of Systems  Gastrointestinal:  Positive for abdominal pain.    Updated Vital Signs BP 121/80   Pulse 77   Temp 97.7 F (36.5 C)   Resp 18   SpO2 99%   Physical Exam Vitals and nursing note reviewed.  Constitutional:      General: She is not in acute distress.    Appearance: She is well-developed. She is not diaphoretic.  HENT:     Head: Normocephalic and atraumatic.     Right Ear: External ear normal.     Left Ear: External ear normal.     Nose: Nose normal.      Mouth/Throat:     Mouth: Mucous membranes are moist.  Eyes:     General: No scleral icterus.    Conjunctiva/sclera: Conjunctivae normal.  Cardiovascular:     Rate and Rhythm: Normal rate and regular rhythm.     Heart sounds: Normal heart sounds. No murmur heard.    No friction rub. No gallop.  Pulmonary:     Effort: Pulmonary effort is normal. No respiratory distress.     Breath sounds: Normal breath sounds.  Abdominal:     General: Bowel sounds are normal. There is no distension.     Palpations: Abdomen is soft. There is no mass.     Tenderness: There is no abdominal tenderness. There is no guarding.     Hernia: No hernia is present.  Musculoskeletal:     Cervical back: Normal range of motion.  Skin:    General: Skin is warm and dry.  Neurological:     Mental Status: She is alert and oriented to person, place, and time.  Psychiatric:        Behavior: Behavior normal.     (all labs ordered are listed, but only abnormal results are displayed) Labs Reviewed  LIPASE, BLOOD - Abnormal; Notable for the following components:      Result Value   Lipase 59 (*)    All other components within normal limits  COMPREHENSIVE METABOLIC PANEL WITH  GFR - Abnormal; Notable for the following components:   Glucose, Bld 100 (*)    All other components within normal limits  CBC  URINALYSIS, ROUTINE W REFLEX MICROSCOPIC  PREGNANCY, URINE    EKG: None  Radiology: No results found.   Procedures   Medications Ordered in the ED - No data to display                                  Medical Decision Making Amount and/or Complexity of Data Reviewed Labs: ordered. Radiology: ordered.  Risk Prescription drug management.   This patient presents to the ED for concern of abd cramping, occasional bloody and mucoid stool, this involves an extensive number of treatment options, and is a complaint that carries with it a high risk of complications and morbidity.   Differential diagnosis  includes constipation, inflammatory bowel disease, irritable bowel disease, less likely bowel obstruction given length of time, less likely AVM.  Potential for hemorrhoids.  Co morbidities:  no significant past medical history  Social Determinants of Health:   SDOH Screenings   Food Insecurity: Low Risk (10/02/2022)   Received from [facility]  Housing: Low Risk (10/02/2022)   Received from [facility]  Transportation Needs: No Transportation Needs (10/02/2022)   Received from [facility]  Utilities: Low Risk (10/02/2022)   Received from [facility]  Tobacco Use: Low Risk (03/03/2024)     Additional history:  {Additional history obtained from father at bedside   Lab Tests:  I Ordered, and personally interpreted labs.  The pertinent results include:   Labs reviewed barely elevated lipase and significant, barely elevated glucose also insignificant remainder of labs are unremarkable  Imaging Studies:  I ordered imaging studies including CT abdomen pelvis with contrast I independently visualized and interpreted imaging which showed no abnormalities noted I agree with the radiologist interpretation     Test Considered:    Critical Interventions:    Consultations Obtained:   Problem List / ED Course:     ICD-10-CM   1. Abdominal cramping  R10.9     2. Hematochezia  K92.1       MDM: Patient with greater than 2 months of daily abdominal cramping she has a very benign exam today.  No active vomiting.  She has no signs of inflammatory bowel disease on CT imaging or any other significant abnormality.  Treat symptomatically with Zofran  and Bentyl .   Dispostion:  After consideration of the diagnostic results and the patients response to treatment, I feel that the patient would benefit from discharge with GI follow  up and return precautions .      Final diagnoses:  None    ED Discharge Orders     None          Arloa Chroman, PA-C 03/03/24  2138    Darra Fonda MATSU, MD 03/09/24 [phone]  "

## 2024-03-03 NOTE — ED Notes (Signed)
 ED Provider at bedside.

## 2024-03-03 NOTE — ED Triage Notes (Signed)
 LLQ abd pain and rectal bleeding for multiple months. Denies NV, urinary symptoms

## 2024-03-03 NOTE — ED Notes (Signed)
 Lab called, stated they will run urine preg now.

## 2024-03-03 NOTE — Discharge Instructions (Addendum)
 1) use Citrucel (methylcellulose) powder or tablets daily as a fiber supplement.  And drink at least 8-10, 8 oz glasses of water a day. 2) take the bentyl  as needed for abdominal cramping, zofran  as needed for any nausea Get help right away if: You cannot stop vomiting. Your pain is only in one part of the abdomen. Pain on the right side could be caused by appendicitis. You have bloody or black poop (stool), or poop that looks like tar. You have trouble breathing. You have chest pain. These symptoms may be an emergency. Get help right away. Call 911.
